# Patient Record
Sex: Male | Born: 1985 | Race: White | Hispanic: No | Marital: Married | Smoking: Never smoker
Health system: Southern US, Community
[De-identification: ages and names within clinical notes are randomized; demographics above are authoritative.]

## PROBLEM LIST (undated history)

## (undated) DIAGNOSIS — K5010 Crohn's disease of large intestine without complications: Secondary | ICD-10-CM

## (undated) DIAGNOSIS — K219 Gastro-esophageal reflux disease without esophagitis: Secondary | ICD-10-CM

## (undated) HISTORY — PX: NO PAST SURGERIES: SHX2092

---

## 2009-02-04 ENCOUNTER — Ambulatory Visit: Payer: Self-pay | Admitting: Internal Medicine

## 2009-02-06 ENCOUNTER — Ambulatory Visit: Payer: Self-pay | Admitting: Internal Medicine

## 2009-02-08 ENCOUNTER — Ambulatory Visit: Payer: Self-pay | Admitting: Internal Medicine

## 2012-12-20 ENCOUNTER — Emergency Department: Payer: Self-pay | Admitting: Unknown Physician Specialty

## 2012-12-20 LAB — URINALYSIS, COMPLETE
Bilirubin,UR: NEGATIVE
Leukocyte Esterase: NEGATIVE
Protein: 100
RBC,UR: 1 /HPF (ref 0–5)
Specific Gravity: 1.035 (ref 1.003–1.030)
WBC UR: <1 /HPF (ref 0–5)

## 2012-12-20 LAB — COMPREHENSIVE METABOLIC PANEL
Albumin: 4.9 g/dL (ref 3.4–5.0)
Anion Gap: 10 (ref 7–16)
BUN: 16 mg/dL (ref 7–18)
Bilirubin,Total: 1.5 mg/dL — ABNORMAL HIGH (ref 0.2–1.0)
Co2: 21 mmol/L (ref 21–32)
EGFR (Non-African Amer.): >60
Osmolality: 271 (ref 275–301)
SGPT (ALT): 41 U/L (ref 12–78)
Total Protein: 9 g/dL — ABNORMAL HIGH (ref 6.4–8.2)

## 2012-12-20 LAB — CBC
HGB: 16.6 g/dL (ref 13.0–18.0)
MCH: 30.6 pg (ref 26.0–34.0)
MCV: 88 fL (ref 80–100)
RBC: 5.42 10*6/uL (ref 4.40–5.90)

## 2012-12-20 LAB — CK TOTAL AND CKMB (NOT AT ARMC): CK, Total: 49 U/L (ref 35–232)

## 2013-01-07 ENCOUNTER — Emergency Department: Payer: Self-pay | Admitting: Emergency Medicine

## 2013-01-07 LAB — COMPREHENSIVE METABOLIC PANEL
Albumin: 5 g/dL (ref 3.4–5.0)
Alkaline Phosphatase: 75 U/L (ref 50–136)
Anion Gap: 6 — ABNORMAL LOW (ref 7–16)
Co2: 29 mmol/L (ref 21–32)
Creatinine: 1.1 mg/dL (ref 0.60–1.30)
Glucose: 98 mg/dL (ref 65–99)
Osmolality: 276 (ref 275–301)
SGOT(AST): 16 U/L (ref 15–37)
Total Protein: 8.8 g/dL — ABNORMAL HIGH (ref 6.4–8.2)

## 2013-01-07 LAB — URINALYSIS, COMPLETE
Bacteria: NONE SEEN
Bilirubin,UR: NEGATIVE
Blood: NEGATIVE
Ketone: NEGATIVE
Ph: 7 (ref 4.5–8.0)
RBC,UR: <1 /HPF (ref 0–5)
Specific Gravity: 1.008 (ref 1.003–1.030)
Squamous Epithelial: NONE SEEN

## 2013-01-07 LAB — CBC
HGB: 16.3 g/dL (ref 13.0–18.0)
MCH: 30.9 pg (ref 26.0–34.0)
MCHC: 35.2 g/dL (ref 32.0–36.0)
MCV: 88 fL (ref 80–100)
RBC: 5.29 10*6/uL (ref 4.40–5.90)
RDW: 12.8 % (ref 11.5–14.5)

## 2013-01-07 LAB — TROPONIN I: Troponin-I: <0.02 ng/mL

## 2013-01-07 LAB — LIPASE, BLOOD: Lipase: 81 U/L (ref 73–393)

## 2013-01-07 LAB — CK TOTAL AND CKMB (NOT AT ARMC): CK-MB: <0.5 ng/mL — ABNORMAL LOW (ref 0.5–3.6)

## 2013-01-08 ENCOUNTER — Emergency Department: Payer: Self-pay | Admitting: Emergency Medicine

## 2013-01-10 ENCOUNTER — Emergency Department: Payer: Self-pay | Admitting: Emergency Medicine

## 2013-01-10 LAB — CBC
MCH: 30.7 pg (ref 26.0–34.0)
MCHC: 35.3 g/dL (ref 32.0–36.0)
MCV: 87 fL (ref 80–100)
Platelet: 264 10*3/uL (ref 150–440)
RBC: 5.27 10*6/uL (ref 4.40–5.90)
RDW: 12.6 % (ref 11.5–14.5)
WBC: 8 10*3/uL (ref 3.8–10.6)

## 2013-01-10 LAB — URINALYSIS, COMPLETE
Bacteria: NONE SEEN
Blood: NEGATIVE
Glucose,UR: NEGATIVE mg/dL (ref 0–75)
Leukocyte Esterase: NEGATIVE
Protein: NEGATIVE
Specific Gravity: 1.017 (ref 1.003–1.030)
Squamous Epithelial: NONE SEEN

## 2013-01-10 LAB — COMPREHENSIVE METABOLIC PANEL
Albumin: 4.9 g/dL (ref 3.4–5.0)
Alkaline Phosphatase: 70 U/L (ref 50–136)
Anion Gap: 8 (ref 7–16)
BUN: 12 mg/dL (ref 7–18)
Bilirubin,Total: 1.5 mg/dL — ABNORMAL HIGH (ref 0.2–1.0)
Calcium, Total: 9.7 mg/dL (ref 8.5–10.1)
Chloride: 101 mmol/L (ref 98–107)
Co2: 30 mmol/L (ref 21–32)
Creatinine: 1.17 mg/dL (ref 0.60–1.30)
EGFR (Non-African Amer.): >60
Glucose: 92 mg/dL (ref 65–99)
Osmolality: 277 (ref 275–301)
Potassium: 3.9 mmol/L (ref 3.5–5.1)
SGOT(AST): 25 U/L (ref 15–37)
SGPT (ALT): 35 U/L (ref 12–78)

## 2013-02-01 ENCOUNTER — Ambulatory Visit: Payer: Self-pay | Admitting: Gastroenterology

## 2013-02-03 LAB — PATHOLOGY REPORT

## 2013-02-11 ENCOUNTER — Emergency Department: Payer: Self-pay | Admitting: Unknown Physician Specialty

## 2013-02-11 LAB — COMPREHENSIVE METABOLIC PANEL
Albumin: 4.9 g/dL (ref 3.4–5.0)
Alkaline Phosphatase: 68 U/L (ref 50–136)
BUN: 9 mg/dL (ref 7–18)
Bilirubin,Total: 1.6 mg/dL — ABNORMAL HIGH (ref 0.2–1.0)
Calcium, Total: 9.9 mg/dL (ref 8.5–10.1)
Chloride: 101 mmol/L (ref 98–107)
Co2: 26 mmol/L (ref 21–32)
EGFR (African American): >60
EGFR (Non-African Amer.): >60
Glucose: 93 mg/dL (ref 65–99)
Potassium: 4.3 mmol/L (ref 3.5–5.1)
SGOT(AST): 24 U/L (ref 15–37)
Sodium: 137 mmol/L (ref 136–145)
Total Protein: 8.9 g/dL — ABNORMAL HIGH (ref 6.4–8.2)

## 2013-02-11 LAB — CBC
HGB: 16.4 g/dL (ref 13.0–18.0)
MCH: 30.5 pg (ref 26.0–34.0)
MCHC: 35.1 g/dL (ref 32.0–36.0)
MCV: 87 fL (ref 80–100)
Platelet: 254 10*3/uL (ref 150–440)
RBC: 5.39 10*6/uL (ref 4.40–5.90)
RDW: 12.7 % (ref 11.5–14.5)

## 2013-02-11 LAB — URINALYSIS, COMPLETE
Bilirubin,UR: NEGATIVE
Blood: NEGATIVE
Glucose,UR: NEGATIVE mg/dL (ref 0–75)
Leukocyte Esterase: NEGATIVE
Nitrite: NEGATIVE
Protein: 100
WBC UR: 1 /HPF (ref 0–5)

## 2013-02-11 LAB — MAGNESIUM: Magnesium: 1.8 mg/dL

## 2013-02-11 LAB — LIPASE, BLOOD: Lipase: 119 U/L (ref 73–393)

## 2014-05-05 ENCOUNTER — Ambulatory Visit: Payer: Self-pay | Admitting: Physician Assistant

## 2014-05-08 ENCOUNTER — Emergency Department: Payer: Self-pay | Admitting: Emergency Medicine

## 2014-07-05 IMAGING — CR DG CHEST 1V PORT
1 series · 2 of 2 positions shown · non-contrast
Comparison: none

REASON FOR EXAM: Chest pain
COMMENTS:

[Series 1: ap · 0.17mm/px · 2 of 2 slices shown]
[im 1/2]
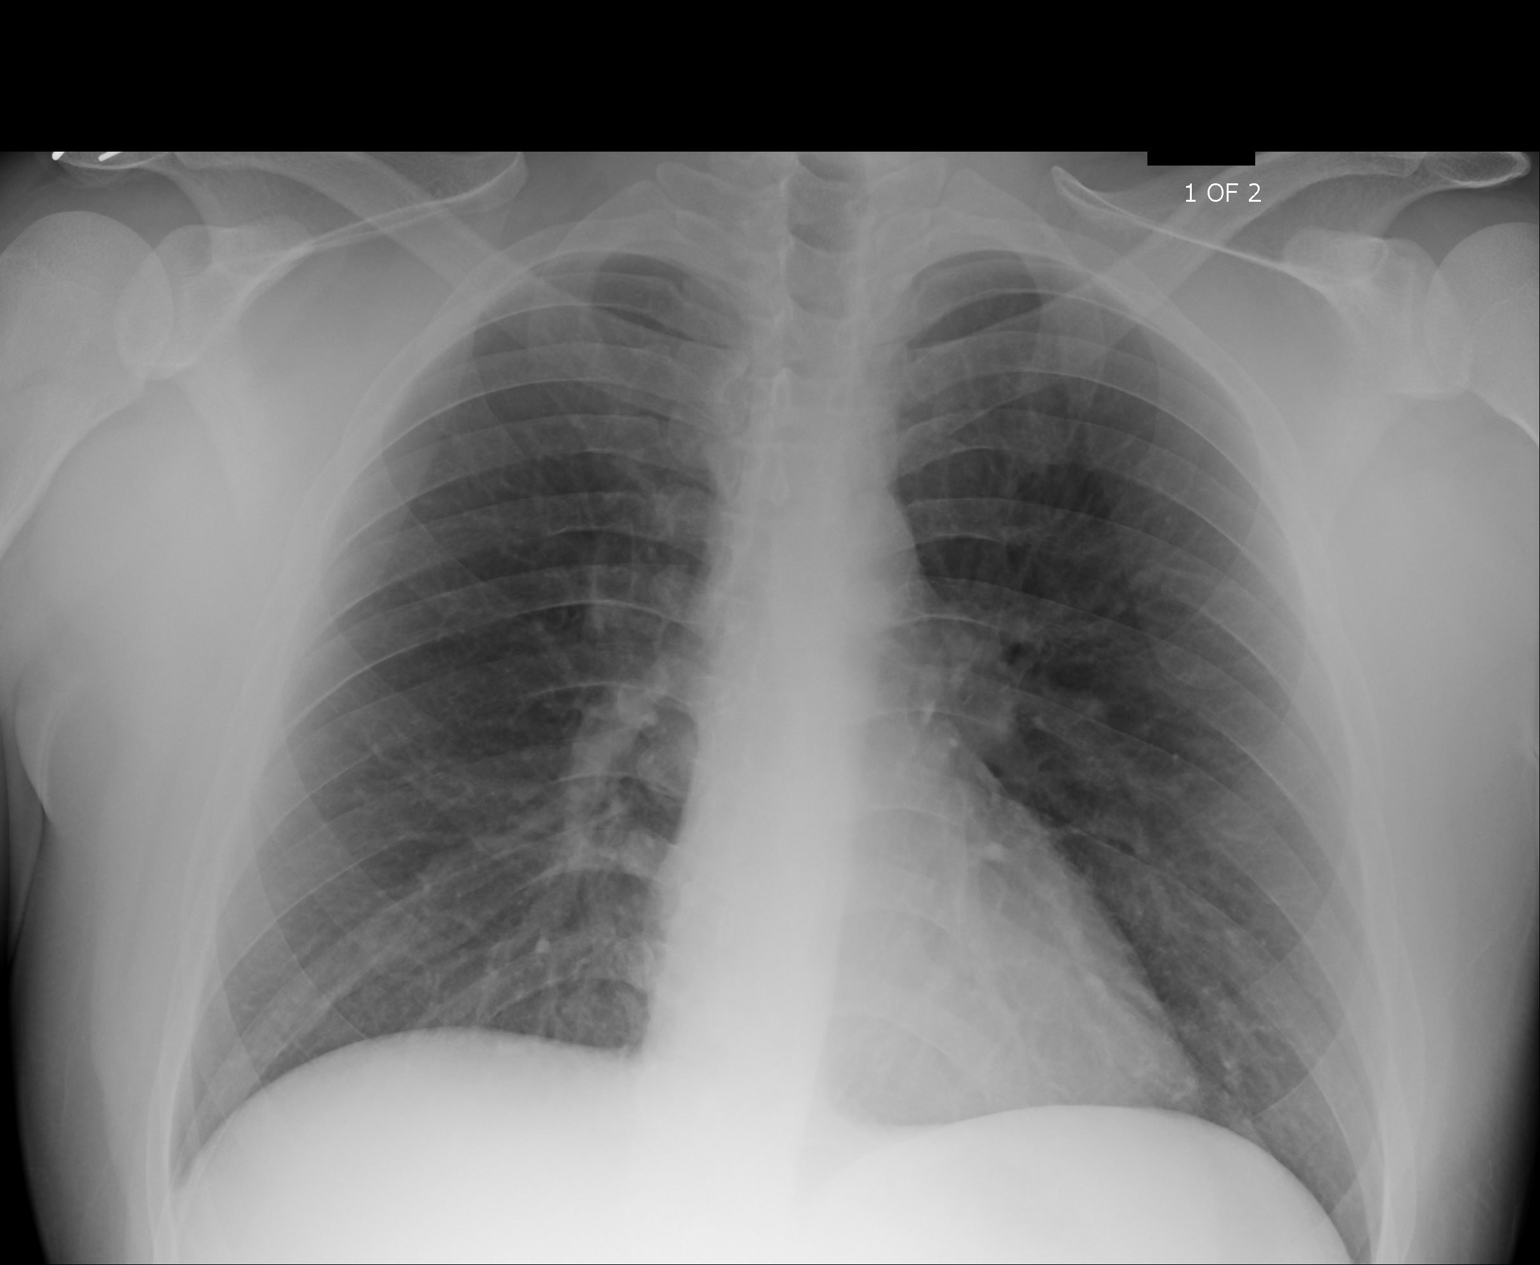
[im 2/2]
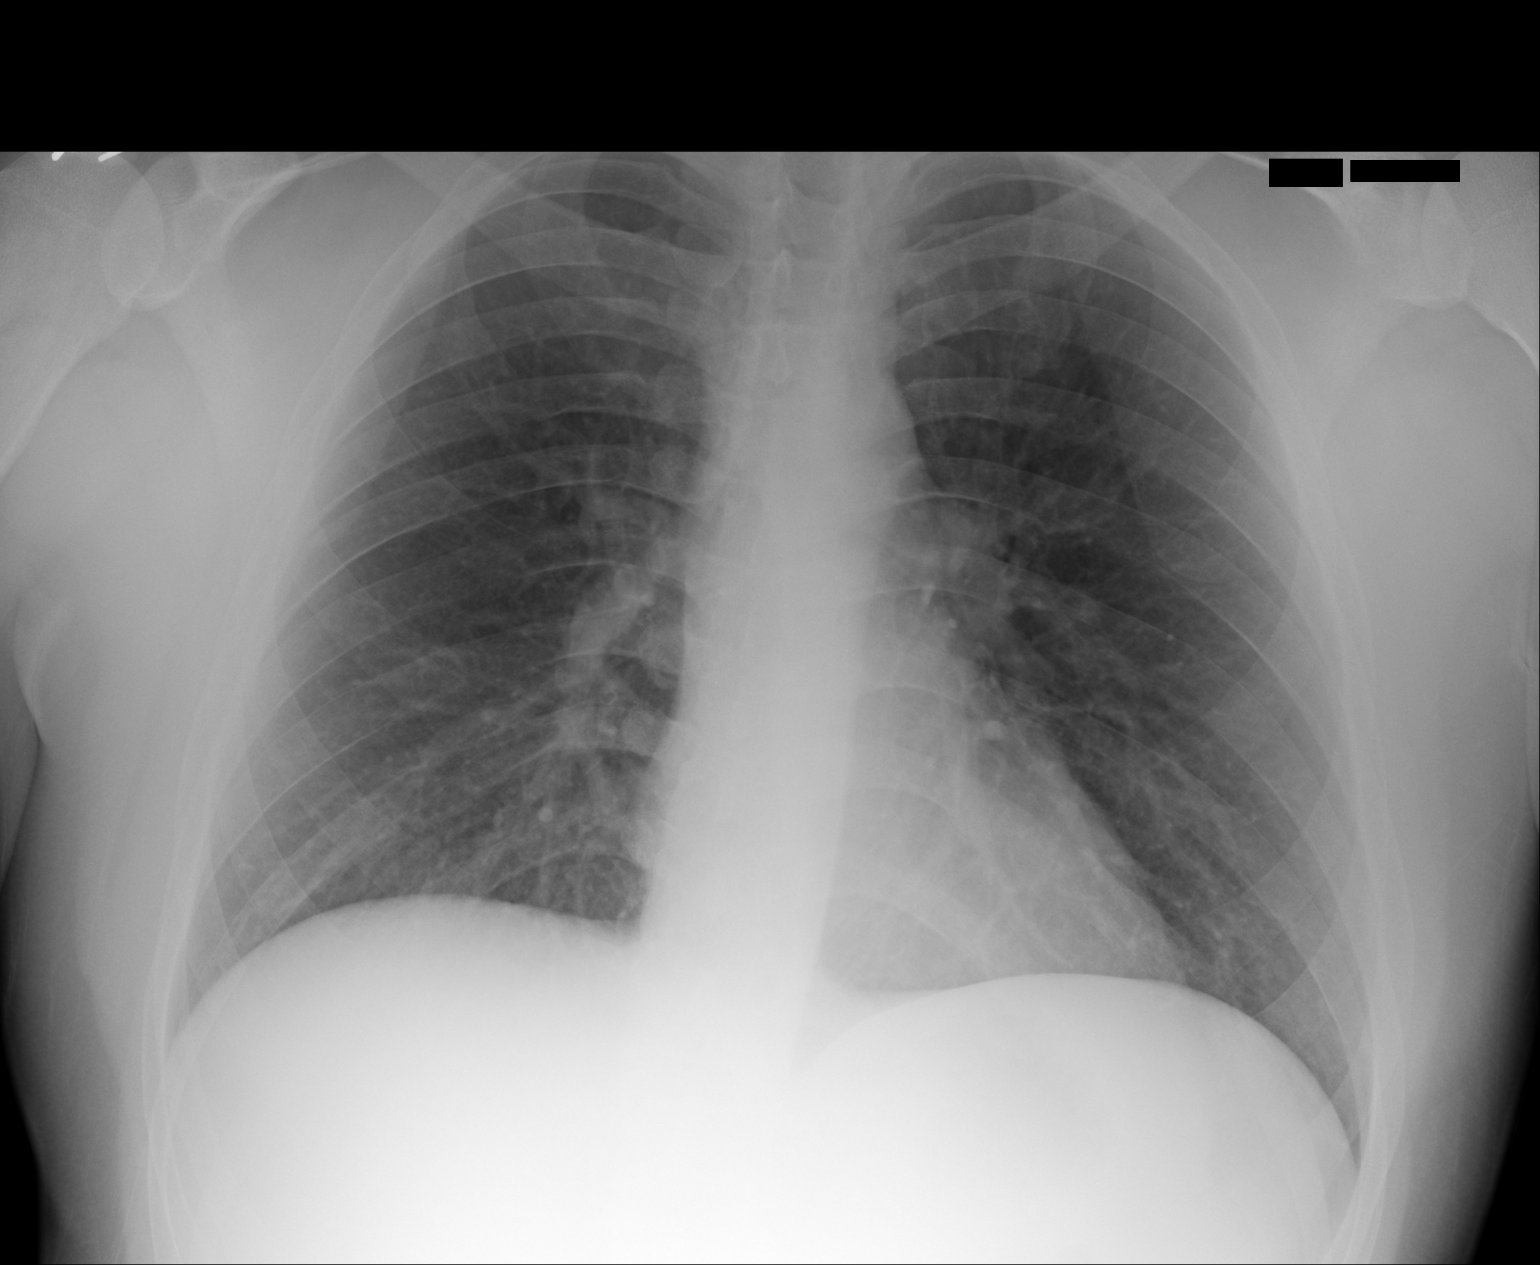

[2 of 2 positions shown; findings below may reference images not displayed]

PROCEDURE:     DXR - DXR PORTABLE CHEST SINGLE VIEW  - January 07, 2013 [DATE]

RESULT:     The lungs are adequately inflated. The cardiac silhouette is
normal in size. There is no pleural effusion or pneumothorax. The
interstitial markings are increased bilaterally. The central pulmonary
vascularity is prominent.
IMPRESSION: Mildly increased interstitial markings diffusely suggest
interstitial edema which may be of cardiac or noncardiac cause. There is no
alveolar pneumonia nor pleural effusion.

[REDACTED]

## 2014-07-06 IMAGING — US ABDOMEN ULTRASOUND LIMITED
1 series · 14 of 25 positions shown · non-contrast
Comparison: none

REASON FOR EXAM: nausea, abd pain - eval for biliary disease
COMMENTS:   Body Site: GB and Fossa, CBD, Head of Pancreas

[Series 1: abdomen ultrasound limited · 0.28mm/px · 14 of 36 slices shown]
[im 1/36]
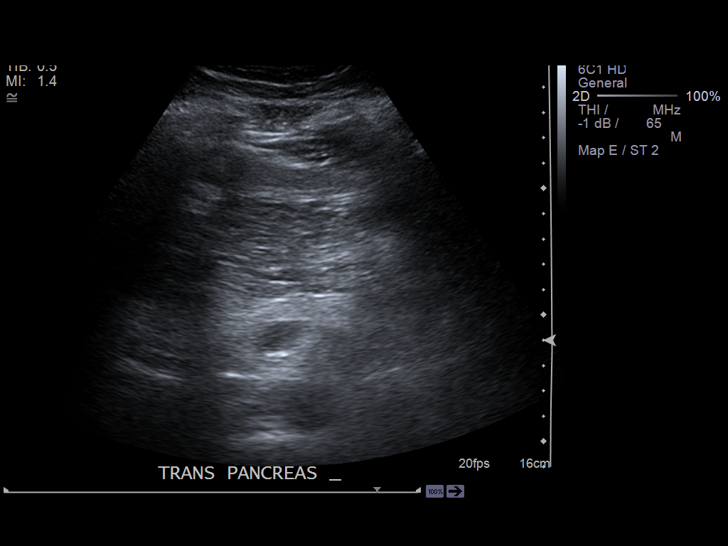
[im 3/36]
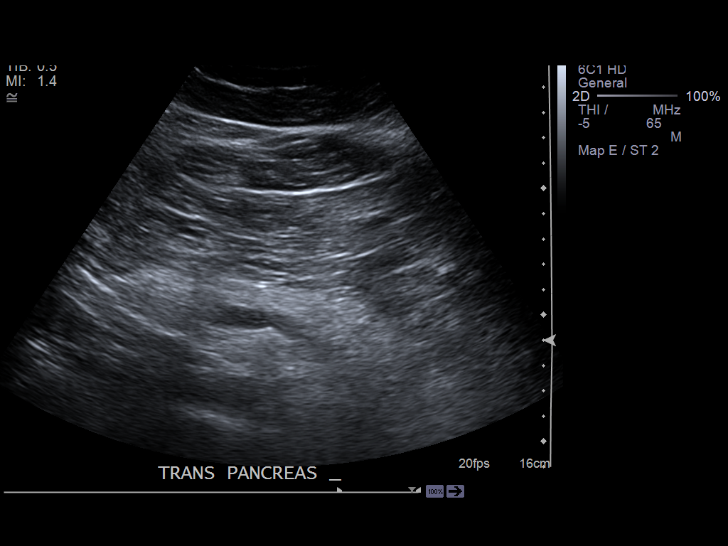
[im 6/36]
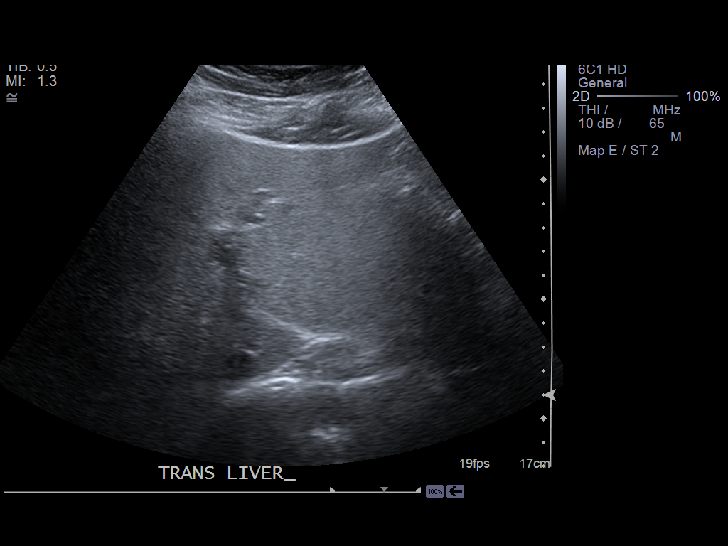
[im 9/36]
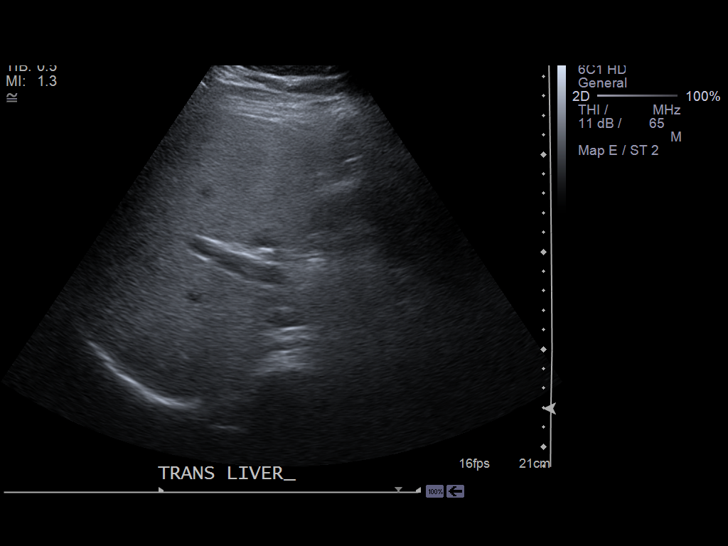
[im 12/36]
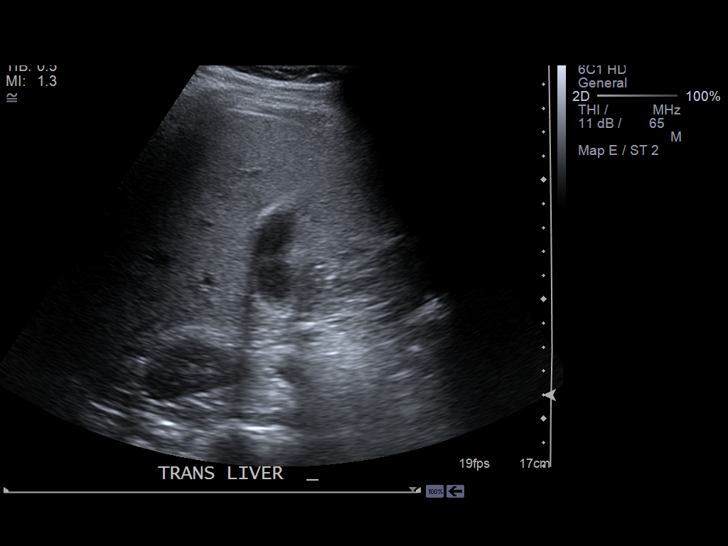
[im 14/36]
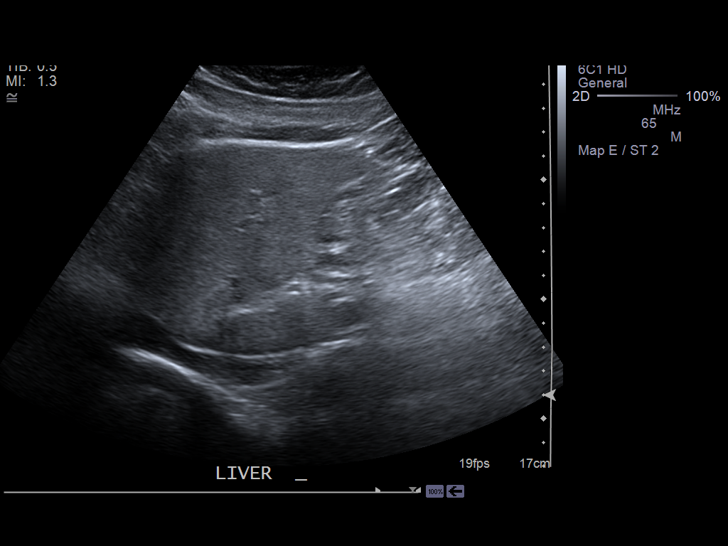
[im 17/36]
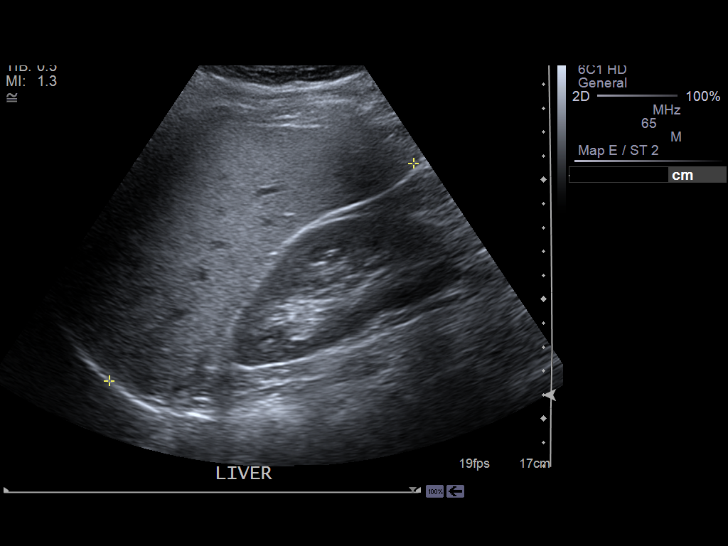
[im 19/36]
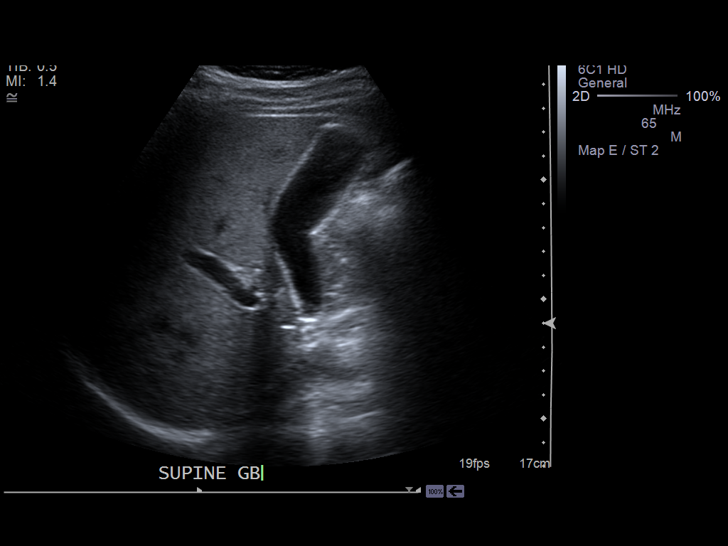
[im 22/36]
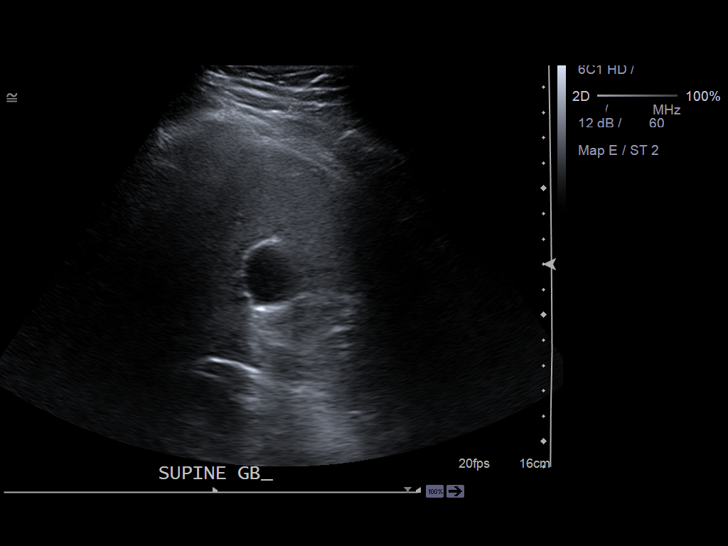
[im 24/36]
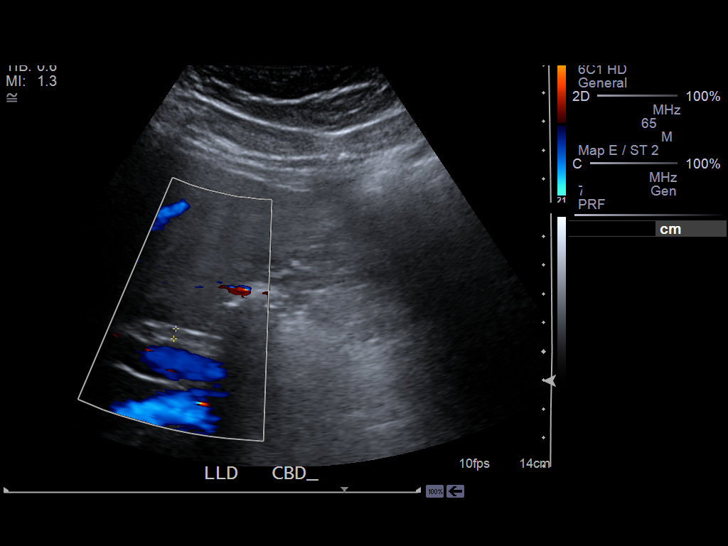
[im 27/36]
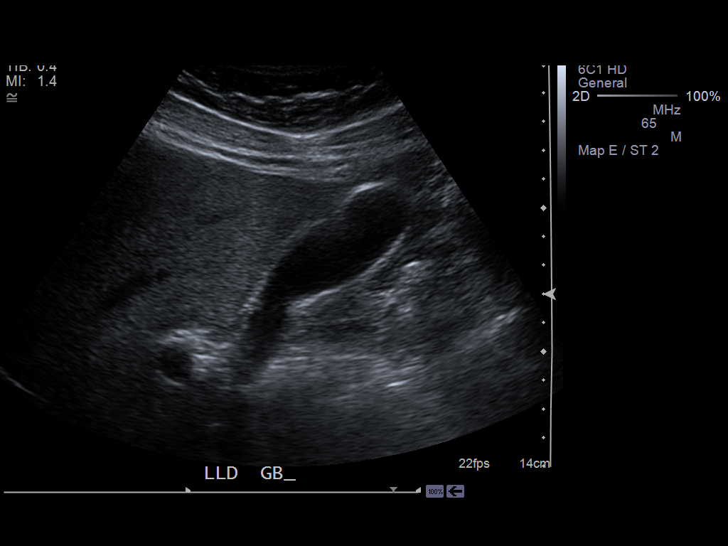
[im 30/36]
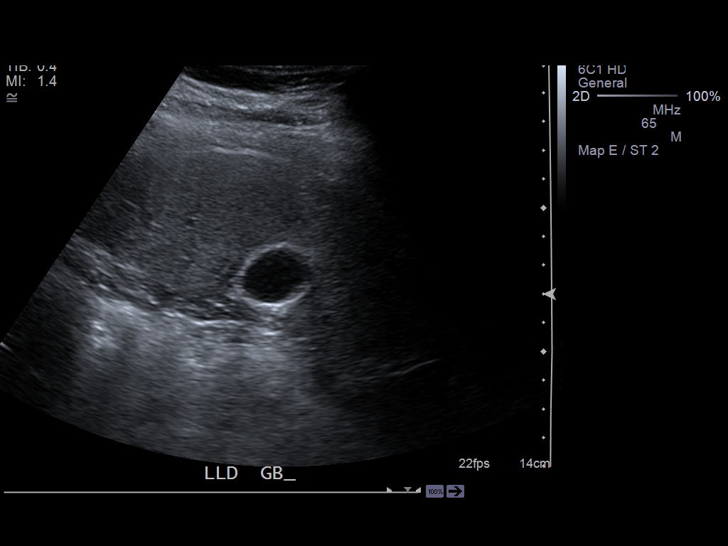
[im 33/36]
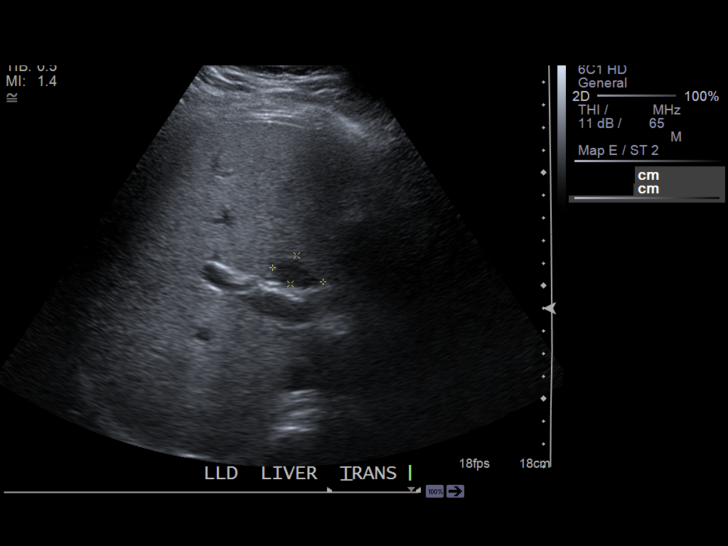
[im 36/36]
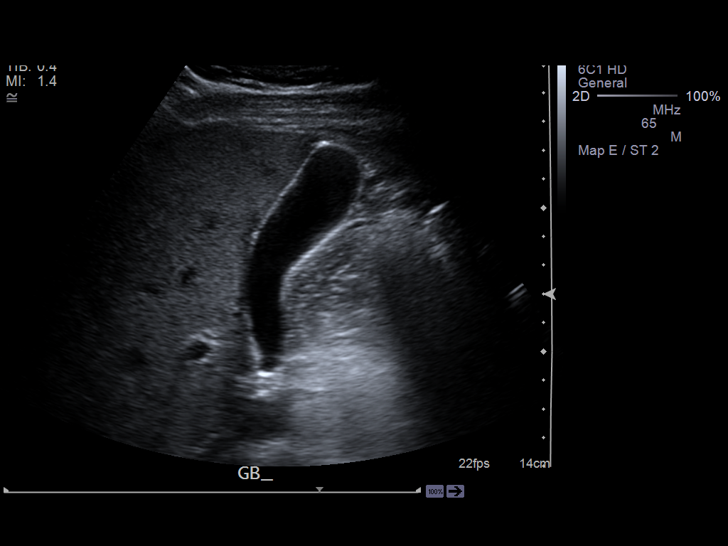

[14 of 25 positions shown; findings below may reference images not displayed]

PROCEDURE:     US  - US ABDOMEN LIMITED SURVEY  - January 08, 2013  [DATE]

RESULT:     A limited upper abdominal ultrasound was performed.

The liver exhibits mildly increased echotexture consistent with fatty
infiltration. An area of presumed focal fatty sparing is demonstrated near
the porta hepatis measuring 2.1 x 1.2 x 2.3 cm. There is no intrahepatic
ductal dilation. Portal venous flow is normal in direction toward the liver.
The gallbladder is adequately distended with no evidence of stones, wall
thickening, or pericholecystic fluid. There is no positive sonographic
Murphy's sign. The common bile duct is normal at 3.5 mm in diameter.
Portions of the pancreas were obscured by bowel gas but the visualized
portion appears normal.
IMPRESSION: 1. There are fatty infiltrative changes of the liver.
2. There is no acute abnormality of the gallbladder common bile duct. No
stones are evident.
3. The observed portions of the pancreas appear normal.

[REDACTED]

## 2015-01-14 ENCOUNTER — Ambulatory Visit
Admission: EM | Admit: 2015-01-14 | Discharge: 2015-01-14 | Disposition: A | Discharge status: Home / Self Care | Payer: No Typology Code available for payment source | Attending: Internal Medicine | Admitting: Internal Medicine

## 2015-01-14 ENCOUNTER — Encounter: Payer: Self-pay | Admitting: *Deleted

## 2015-01-14 DIAGNOSIS — L0103 Bullous impetigo: Secondary | ICD-10-CM | POA: Diagnosis not present

## 2015-01-14 MED ORDER — SULFAMETHOXAZOLE-TRIMETHOPRIM 800-160 MG PO TABS: 1.0000 | ORAL_TABLET | Freq: Two times a day (BID) | ORAL | Status: DC

## 2015-01-14 MED ORDER — SULFAMETHOXAZOLE-TRIMETHOPRIM 800-160 MG PO TABS: 1.0000 | ORAL_TABLET | Freq: Two times a day (BID) | ORAL | Status: AC

## 2015-01-14 MED ORDER — CEPHALEXIN 500 MG PO CAPS: 500.0000 mg | ORAL_CAPSULE | Freq: Two times a day (BID) | ORAL | Status: DC

## 2015-01-14 NOTE — ED Provider Notes (Signed)
CSN: [id number]     Arrival date & time 01/14/15  1318 History   First MD Initiated Contact with Patient 01/14/15 1408     Chief Complaint  Patient presents with  . Rash    on face   . Blister    on hands    Patient is a 29 y.o. male presenting with rash.  Rash  Pt states "rash on face and blisters on hands started yesterday, had fever and vomiting/diarrhea symptoms on Friday lasted until Sunday morning, daughter had hand mouth and foot last week"           Currently, no fever, no malaise.  No new meds, supplements, skin care products, other exposures. GI sx's have subsided.  No cough.  Rash onset yesterday.   History reviewed. No pertinent past medical history. History reviewed. No pertinent past surgical history. No family history on file. History  Substance Use Topics  . Smoking status: Never Smoker   . Smokeless tobacco: Not on file  . Alcohol Use: No    Review of Systems  Skin: Positive for rash.  All other systems reviewed and are negative.   Allergies  Review of patient's allergies indicates no known allergies.  Home Medications   Prior to Admission medications   Medication Sig Start Date End Date Taking? Authorizing Provider  ranitidine (ZANTAC) 150 MG tablet Take 150 mg by mouth 2 (two) times daily.   Yes Historical Provider, MD                 BP 129/70 mmHg  Pulse 82  Temp(Src) 98.2 F (36.8 C) (Oral)  Resp 16  Ht 5' 11"  (1.803 m)  Wt 225 lb (102.059 kg)  BMI 31.39 kg/m2  SpO2 100% Physical Exam  Constitutional: He is oriented to person, place, and time. No distress.  Alert, nicely groomed  HENT:  Head: Atraumatic.  Eyes:  Conjugate gaze, no eye redness/drainage  Neck: Neck supple.  Cardiovascular: Normal rate.   Pulmonary/Chest: No respiratory distress.  Abdominal: Soft. He exhibits no distension.  Musculoskeletal: Normal range of motion.  Neurological: He is alert and oriented to person, place, and time.  Skin: Skin is warm and dry.       No cyanosis Rash on face, neck, dorsal hands/forearms primarily. See diagram.  Red papules on face, neck, 3-32m, with shallow erosions and golden crusting under/in nares.   Excoriated papules dorsal forearms, 10-19m with some golden crust.   Deep/firm blisters proximal fingers, not really tender  Nursing note and vitals reviewed.   ED Course  Procedures  No procedures in the urgent care today.  MDM   1. Impetigo bullosa    rx bactrim/keflex to cover MRSA/strep.  Wash skin with soap/water 1-2x daily, gently, and apply abx ointment.  Anticipate gradual improvement over the next 3-5 days.    LaSherlene ShamsMD 01/14/15 [phone]

## 2015-01-14 NOTE — Discharge Instructions (Signed)
°  Prescriptions for bactrim and keflex. Wash raw patches with soap/water 1-2 times daily and apply antibiotic ointment. Anticipate gradual improvement over the next 3-5 days.

## 2015-01-14 NOTE — ED Notes (Signed)
Pt states "rash on face and blisters on hands started yesterday, had fever and flu like symptoms on Friday lasted until Sunday morning, daughter had hand mouth and foot last week"

## 2017-06-25 ENCOUNTER — Encounter: Payer: Self-pay | Admitting: Emergency Medicine

## 2017-06-25 ENCOUNTER — Other Ambulatory Visit: Payer: Self-pay

## 2017-06-25 ENCOUNTER — Emergency Department
Admission: EM | Admit: 2017-06-25 | Discharge: 2017-06-25 | Disposition: A | Discharge status: Home / Self Care | Payer: Self-pay | Attending: Student in an Organized Health Care Education/Training Program | Admitting: Student in an Organized Health Care Education/Training Program

## 2017-06-25 ENCOUNTER — Emergency Department: Payer: Self-pay

## 2017-06-25 DIAGNOSIS — R1013 Epigastric pain: Secondary | ICD-10-CM | POA: Insufficient documentation

## 2017-06-25 DIAGNOSIS — Z79899 Other long term (current) drug therapy: Secondary | ICD-10-CM | POA: Insufficient documentation

## 2017-06-25 DIAGNOSIS — R112 Nausea with vomiting, unspecified: Secondary | ICD-10-CM | POA: Insufficient documentation

## 2017-06-25 LAB — CBC WITH DIFFERENTIAL/PLATELET
BASOS PCT: 1 %
Basophils Absolute: 0.1 10*3/uL (ref 0–0.1)
Eosinophils Absolute: 0 10*3/uL (ref 0–0.7)
Eosinophils Relative: 0 %
HEMATOCRIT: 48 % (ref 40.0–52.0)
Hemoglobin: 16.7 g/dL (ref 13.0–18.0)
LYMPHS PCT: 6 %
Lymphs Abs: 0.6 10*3/uL — ABNORMAL LOW (ref 1.0–3.6)
MCH: 30.4 pg (ref 26.0–34.0)
MCHC: 34.7 g/dL (ref 32.0–36.0)
MCV: 87.6 fL (ref 80.0–100.0)
MONO ABS: 0.5 10*3/uL (ref 0.2–1.0)
MONOS PCT: 5 %
NEUTROS ABS: 8.9 10*3/uL — AB (ref 1.4–6.5)
Neutrophils Relative %: 88 %
Platelets: 234 10*3/uL (ref 150–440)
RBC: 5.48 MIL/uL (ref 4.40–5.90)
RDW: 12.6 % (ref 11.5–14.5)
WBC: 10.1 10*3/uL (ref 3.8–10.6)

## 2017-06-25 LAB — COMPREHENSIVE METABOLIC PANEL
ALT: 54 U/L (ref 17–63)
ANION GAP: 9 (ref 5–15)
AST: 34 U/L (ref 15–41)
Albumin: 4.9 g/dL (ref 3.5–5.0)
Alkaline Phosphatase: 43 U/L (ref 38–126)
BILIRUBIN TOTAL: 1.1 mg/dL (ref 0.3–1.2)
BUN: 18 mg/dL (ref 6–20)
CO2: 26 mmol/L (ref 22–32)
Calcium: 9.7 mg/dL (ref 8.9–10.3)
Chloride: 103 mmol/L (ref 101–111)
Creatinine, Ser: 0.89 mg/dL (ref 0.61–1.24)
GFR calc Af Amer: >60 mL/min (ref >60)
Glucose, Bld: 134 mg/dL — ABNORMAL HIGH (ref 65–99)
POTASSIUM: 4.7 mmol/L (ref 3.5–5.1)
Sodium: 138 mmol/L (ref 135–145)
TOTAL PROTEIN: 8.1 g/dL (ref 6.5–8.1)

## 2017-06-25 LAB — LIPASE, BLOOD: LIPASE: 23 U/L (ref 11–51)

## 2017-06-25 MED ORDER — PROMETHAZINE HCL 12.5 MG PO TABS: 12.5000 mg | ORAL_TABLET | Freq: Four times a day (QID) | ORAL | 0 refills | Status: DC | PRN

## 2017-06-25 MED ORDER — GI COCKTAIL ~~LOC~~
30.0000 mL | Freq: Once | ORAL | Status: AC
  Administered 2017-06-25: 30 mL via ORAL
  Filled 2017-06-25: qty 30

## 2017-06-25 MED ORDER — SUCRALFATE 1 GM/10ML PO SUSP: 1.0000 g | Freq: Four times a day (QID) | ORAL | 1 refills | Status: DC

## 2017-06-25 MED ORDER — PROMETHAZINE HCL 25 MG/ML IJ SOLN
12.5000 mg | Freq: Four times a day (QID) | INTRAMUSCULAR | Status: DC | PRN
  Administered 2017-06-25: 12.5 mg via INTRAVENOUS
  Filled 2017-06-25: qty 1

## 2017-06-25 MED ORDER — PROMETHAZINE HCL 25 MG/ML IJ SOLN: 25.0000 mg | Freq: Four times a day (QID) | INTRAMUSCULAR | Status: DC | PRN

## 2017-06-25 NOTE — ED Triage Notes (Signed)
Arrives with c/o abdominal pain and vomiting since last night.

## 2017-06-25 NOTE — Discharge Instructions (Signed)

## 2017-06-25 NOTE — ED Provider Notes (Signed)
[facility] Emergency Department Provider Note    First MD Initiated Contact with Patient 06/25/17 [phone]     (approximate)  I have reviewed the triage vital signs and the nursing notes.   HISTORY  Chief Complaint Abdominal Pain and Emesis    HPI John Hayes is a 31 y.o. male with a history of acid reflux not currently taking any medications with no recent hospitalizations presents with pain.  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  Denies any fevers.  No chest pain or shortness of breath.  Points to just below his xiphoid and epigastric area when asked where the pain is.  States that it is mild to moderate in severity.  No pain radiating through to his back.  No diarrhea.  No blood in his vomit.  No dysuria or hematuria.  History reviewed. No pertinent past medical history. No family history on file. History reviewed. No pertinent surgical history. There are no active problems to display for this patient.     Prior to Admission medications   Medication Sig Start Date End Date Taking? Authorizing Provider  cephALEXin (KEFLEX) 500 MG capsule Take 1 capsule (500 mg total) by mouth 2 (two) times daily. 01/14/15   Sherlene Shams, MD  ranitidine (ZANTAC) 150 MG tablet Take 150 mg by mouth 2 (two) times daily.    [provider]    Allergies Zofran Alvis Lemmings hcl]    Social History Social History   Tobacco Use  . Smoking status: Never Smoker  . Smokeless tobacco: Never Used  Substance Use Topics  . Alcohol use: No  . Drug use: No    Review of Systems Patient denies headaches, rhinorrhea, blurry vision, numbness, shortness of breath, chest pain, edema, cough, abdominal pain, nausea, vomiting, diarrhea, dysuria, fevers, rashes or hallucinations unless otherwise stated above in  HPI. ____________________________________________   PHYSICAL EXAM:  VITAL SIGNS: Vitals:   06/25/17 0826  BP: 126/76  Pulse: 93  Resp: 16  Temp: 98.1 F (36.7 C)  SpO2: 99%    Constitutional: Alert and oriented. Well appearing and in no acute distress. Eyes: Conjunctivae are normal.  Head: Atraumatic. Nose: No congestion/rhinnorhea. Mouth/Throat: Mucous membranes are moist.   Neck: No stridor. Painless ROM.  Cardiovascular: Normal rate, regular rhythm. Grossly normal heart sounds.  Good peripheral circulation. Respiratory: Normal respiratory effort.  No retractions. Lungs CTAB. Gastrointestinal: Soft and nontender. No distention. No abdominal bruits. No CVA tenderness. Genitourinary:  Musculoskeletal: No lower extremity tenderness nor edema.  No joint effusions. Neurologic:  Normal speech and language. No gross focal neurologic deficits are appreciated. No facial droop Skin:  Skin is warm, dry and intact. No rash noted. Psychiatric: Mood and affect are normal. Speech and behavior are normal.  ____________________________________________   LABS (all labs ordered are listed, but only abnormal results are displayed)  Results for orders placed or performed during the hospital encounter of 06/25/17 (from the past 24 hour(s))  CBC with Differential/Platelet     Status: Abnormal   Collection Time: 06/25/17  8:45 AM  Result Value Ref Range   WBC 10.1 3.8 - 10.6 K/uL   RBC 5.48 4.40 - 5.90 MIL/uL   Hemoglobin 16.7 13.0 - 18.0 g/dL   HCT 48.0 40.0 - 52.0 %   MCV 87.6 80.0 - 100.0 fL   MCH 30.4 26.0 - 34.0 pg  MCHC 34.7 32.0 - 36.0 g/dL   RDW 12.6 11.5 - 14.5 %   Platelets 234 150 - 440 K/uL   Neutrophils Relative % 88 %   Neutro Abs 8.9 (H) 1.4 - 6.5 K/uL   Lymphocytes Relative 6 %   Lymphs Abs 0.6 (L) 1.0 - 3.6 K/uL   Monocytes Relative 5 %   Monocytes Absolute 0.5 0.2 - 1.0 K/uL   Eosinophils Relative 0 %   Eosinophils Absolute 0.0 0 - 0.7 K/uL   Basophils Relative  1 %   Basophils Absolute 0.1 0 - 0.1 K/uL  Comprehensive metabolic panel     Status: Abnormal   Collection Time: 06/25/17  8:45 AM  Result Value Ref Range   Sodium 138 135 - 145 mmol/L   Potassium 4.7 3.5 - 5.1 mmol/L   Chloride 103 101 - 111 mmol/L   CO2 26 22 - 32 mmol/L   Glucose, Bld 134 (H) 65 - 99 mg/dL   BUN 18 6 - 20 mg/dL   Creatinine, Ser 0.89 0.61 - 1.24 mg/dL   Calcium 9.7 8.9 - 10.3 mg/dL   Total Protein 8.1 6.5 - 8.1 g/dL   Albumin 4.9 3.5 - 5.0 g/dL   AST 34 15 - 41 U/L   ALT 54 17 - 63 U/L   Alkaline Phosphatase 43 38 - 126 U/L   Total Bilirubin 1.1 0.3 - 1.2 mg/dL   GFR calc non Af Amer >60 >60 mL/min   GFR calc Af Amer >60 >60 mL/min   Anion gap 9 5 - 15  Lipase, blood     Status: None   Collection Time: 06/25/17  8:45 AM  Result Value Ref Range   Lipase 23 11 - 51 U/L   ____________________________________________ ____________________________________________  RADIOLOGY  I personally reviewed all radiographic images ordered to evaluate for the above acute complaints and reviewed radiology reports and findings.  These findings were personally discussed with the patient.  Please see medical record for radiology report.  ____________________________________________   PROCEDURES  Procedure(s) performed:  Procedures    Critical Care performed: no ____________________________________________   INITIAL IMPRESSION / ASSESSMENT AND PLAN / ED COURSE  Pertinent labs & imaging results that were available during my care of the patient were reviewed by me and considered in my medical decision making (see chart for details).  DDX: Gastritis, reflux, Boerhaave's, peptic ulcer disease, enteritis.  John Hayes is a 31 y.o. who presents to the ED with epigastric discomfort nausea and vomiting as described above.  He is afebrile and hemodynamically stable and well-appearing.  Abdominal exam is soft and benign.  This not clinically consistent with acute  biliary pathology or acute appendicitis.  Blood work is reassuring.  Patient does have history of long-standing reflux disease.  Encouraged patient to restart taking his ranitidine as he states he stopped taking that several weeks ago.  Will give prescription for Phenergan and Carafate with referral to GI.  Patient with symptoms improved after GI cocktail and IV medications.  Patient stable for discharge home.  Instructed to return in 12-24 hours should his pain worsen or he develops any fevers.  Have discussed with the patient and available family all diagnostics and treatments performed thus far and all questions were answered to the best of my ability. The patient demonstrates understanding and agreement with plan.       ____________________________________________   FINAL CLINICAL IMPRESSION(S) / ED DIAGNOSES  Final diagnoses:  Non-intractable vomiting with nausea, unspecified vomiting type  Epigastric pain      NEW MEDICATIONS STARTED DURING THIS VISIT:  This SmartLink is deprecated. Use AVSMEDLIST instead to display the medication list for a patient.   Note:  This document was prepared using Dragon voice recognition software and may include unintentional dictation errors.    Merlyn Lot, MD 06/25/17 [phone]

## 2017-06-25 NOTE — ED Notes (Signed)
First Nurse Note:  Abdominal pain with nausea and vomiting starting last PM.  Speech clear, no apparent distress.

## 2017-08-10 DIAGNOSIS — K50012 Crohn's disease of small intestine with intestinal obstruction: Secondary | ICD-10-CM | POA: Diagnosis not present

## 2017-08-10 DIAGNOSIS — K5000 Crohn's disease of small intestine without complications: Secondary | ICD-10-CM | POA: Diagnosis not present

## 2017-08-10 DIAGNOSIS — Z79899 Other long term (current) drug therapy: Secondary | ICD-10-CM | POA: Diagnosis not present

## 2017-08-10 DIAGNOSIS — Z7952 Long term (current) use of systemic steroids: Secondary | ICD-10-CM | POA: Diagnosis not present

## 2017-09-07 DIAGNOSIS — Z23 Encounter for immunization: Secondary | ICD-10-CM | POA: Diagnosis not present

## 2018-05-16 ENCOUNTER — Other Ambulatory Visit: Payer: Self-pay

## 2018-05-16 ENCOUNTER — Ambulatory Visit
Admission: EM | Admit: 2018-05-16 | Discharge: 2018-05-16 | Disposition: A | Discharge status: Home / Self Care | Payer: 59 | Attending: Family Medicine | Admitting: Family Medicine

## 2018-05-16 ENCOUNTER — Ambulatory Visit (INDEPENDENT_AMBULATORY_CARE_PROVIDER_SITE_OTHER): Payer: 59

## 2018-05-16 DIAGNOSIS — R0789 Other chest pain: Secondary | ICD-10-CM | POA: Diagnosis not present

## 2018-05-16 DIAGNOSIS — R0781 Pleurodynia: Secondary | ICD-10-CM | POA: Diagnosis not present

## 2018-05-16 DIAGNOSIS — S299XXA Unspecified injury of thorax, initial encounter: Secondary | ICD-10-CM | POA: Diagnosis not present

## 2018-05-16 HISTORY — DX: Gastro-esophageal reflux disease without esophagitis: K21.9

## 2018-05-16 HISTORY — DX: Crohn's disease of large intestine without complications: K50.10

## 2018-05-16 MED ORDER — MELOXICAM 15 MG PO TABS: 15.0000 mg | ORAL_TABLET | Freq: Every day | ORAL | 0 refills | Status: DC | PRN
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
## 2018-05-16 NOTE — Discharge Instructions (Signed)
OTC tylenol and ibuprofen as needed.  Xrays normal.  Take care  Dr. Lacinda Axon
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
## 2018-07-08 DIAGNOSIS — K5090 Crohn's disease, unspecified, without complications: Secondary | ICD-10-CM | POA: Diagnosis not present

## 2018-07-08 DIAGNOSIS — R161 Splenomegaly, not elsewhere classified: Secondary | ICD-10-CM | POA: Diagnosis not present

## 2018-07-08 DIAGNOSIS — K760 Fatty (change of) liver, not elsewhere classified: Secondary | ICD-10-CM | POA: Diagnosis not present

## 2018-07-08 DIAGNOSIS — R112 Nausea with vomiting, unspecified: Secondary | ICD-10-CM | POA: Diagnosis not present

## 2018-07-08 DIAGNOSIS — R1110 Vomiting, unspecified: Secondary | ICD-10-CM | POA: Diagnosis not present

## 2018-07-08 DIAGNOSIS — R197 Diarrhea, unspecified: Secondary | ICD-10-CM | POA: Diagnosis not present

## 2018-07-08 DIAGNOSIS — R1031 Right lower quadrant pain: Secondary | ICD-10-CM | POA: Diagnosis not present

## 2018-09-15 DIAGNOSIS — R740 Nonspecific elevation of levels of transaminase and lactic acid dehydrogenase [LDH]: Secondary | ICD-10-CM | POA: Diagnosis not present

## 2018-09-15 DIAGNOSIS — D7589 Other specified diseases of blood and blood-forming organs: Secondary | ICD-10-CM | POA: Diagnosis not present

## 2018-09-15 DIAGNOSIS — K50812 Crohn's disease of both small and large intestine with intestinal obstruction: Secondary | ICD-10-CM | POA: Diagnosis not present

## 2018-09-23 DIAGNOSIS — K50812 Crohn's disease of both small and large intestine with intestinal obstruction: Secondary | ICD-10-CM | POA: Diagnosis not present

## 2018-09-23 DIAGNOSIS — R197 Diarrhea, unspecified: Secondary | ICD-10-CM | POA: Diagnosis not present

## 2018-09-23 DIAGNOSIS — K760 Fatty (change of) liver, not elsewhere classified: Secondary | ICD-10-CM | POA: Diagnosis not present

## 2018-09-23 DIAGNOSIS — K5090 Crohn's disease, unspecified, without complications: Secondary | ICD-10-CM | POA: Diagnosis not present

## 2018-11-29 DIAGNOSIS — E875 Hyperkalemia: Secondary | ICD-10-CM | POA: Diagnosis not present

## 2018-11-29 DIAGNOSIS — K50819 Crohn's disease of both small and large intestine with unspecified complications: Secondary | ICD-10-CM | POA: Diagnosis not present

## 2018-12-21 IMAGING — CR DG CHEST 2V
1 series · 2 of 2 positions shown · non-contrast
Comparison: 03/01/2013

CLINICAL DATA: Lower chest and epigastric pain.

EXAM:
CHEST  2 VIEW

[Series 1: dg chest 2 view · 0.14mm/px · 2 of 2 slices shown]
[im 1/2]
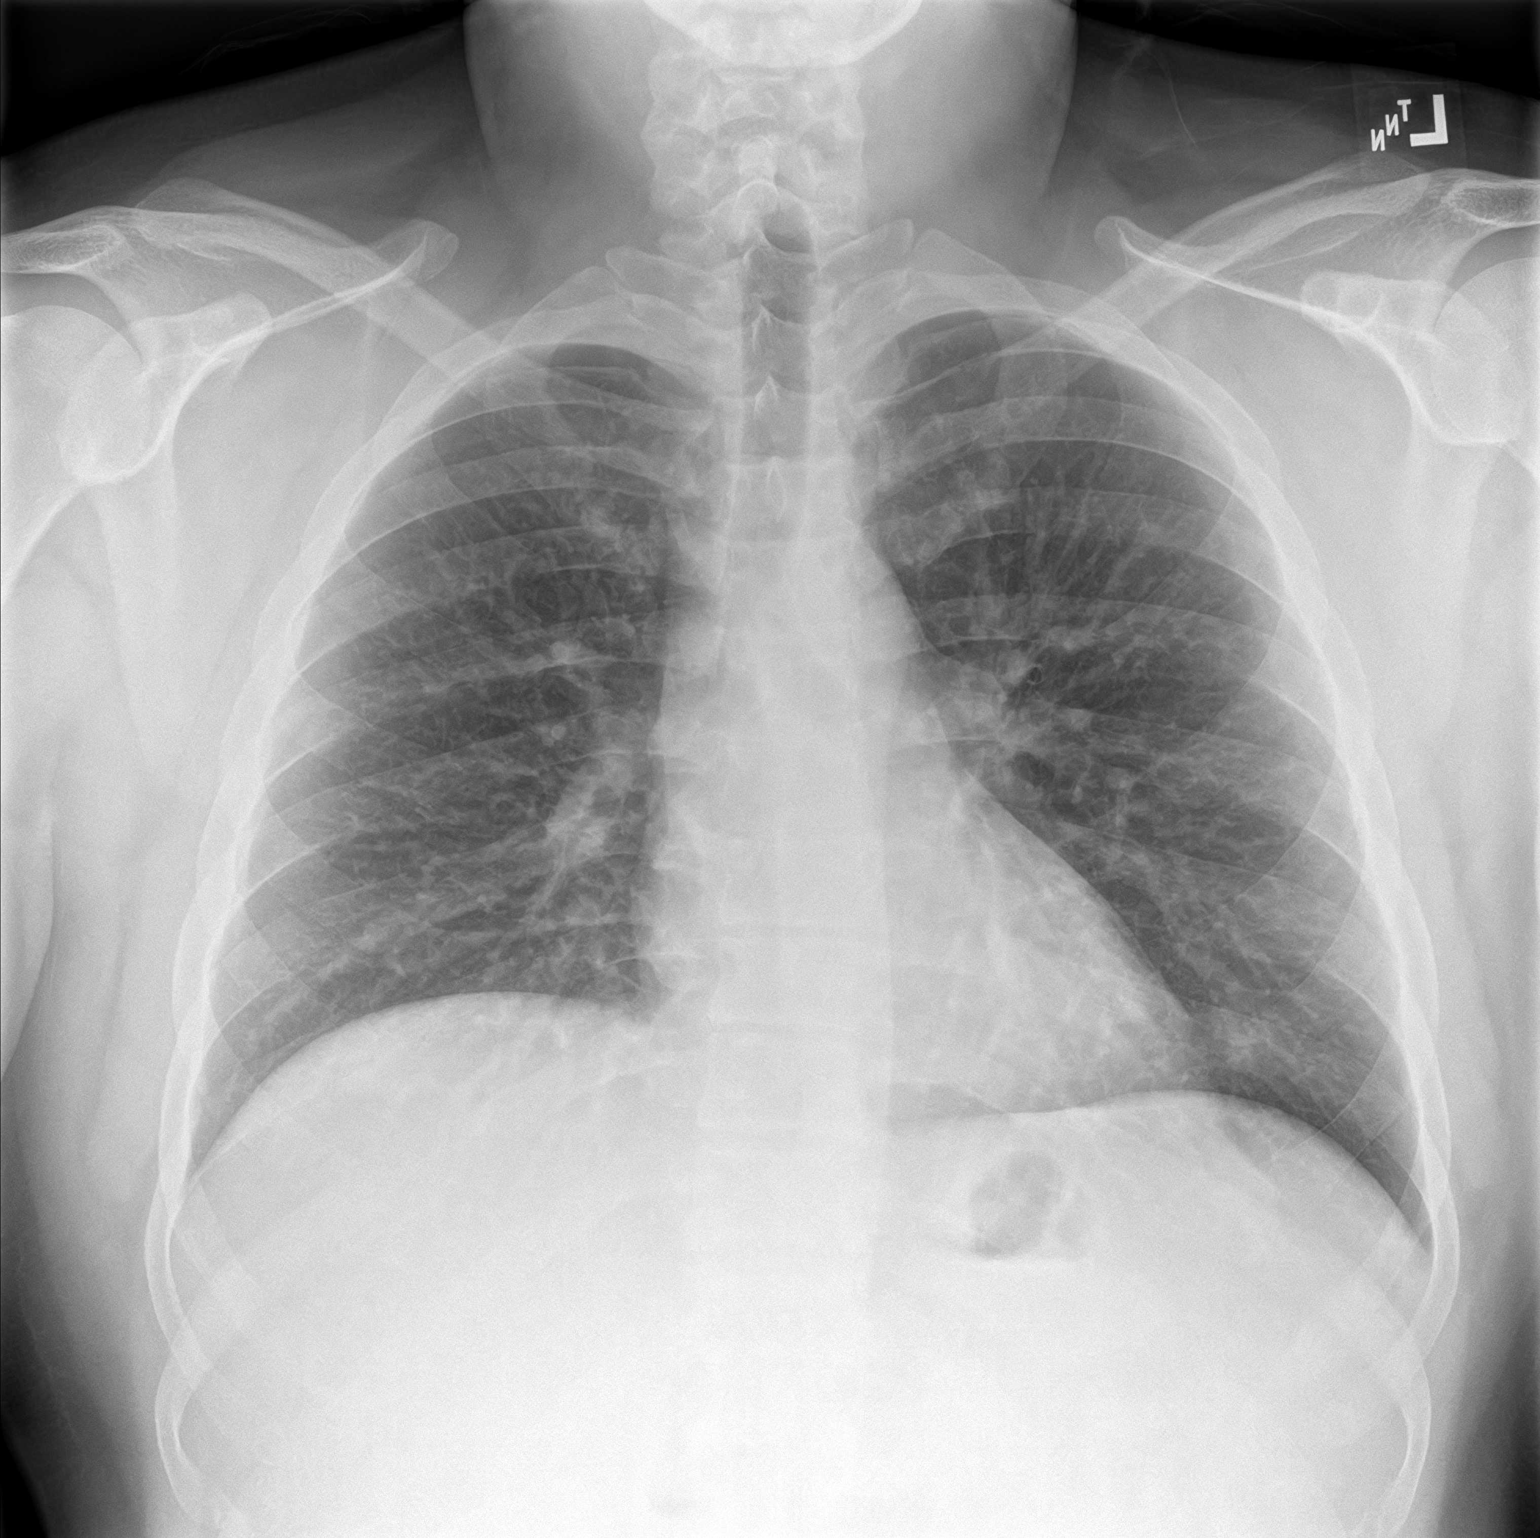
[im 2/2]
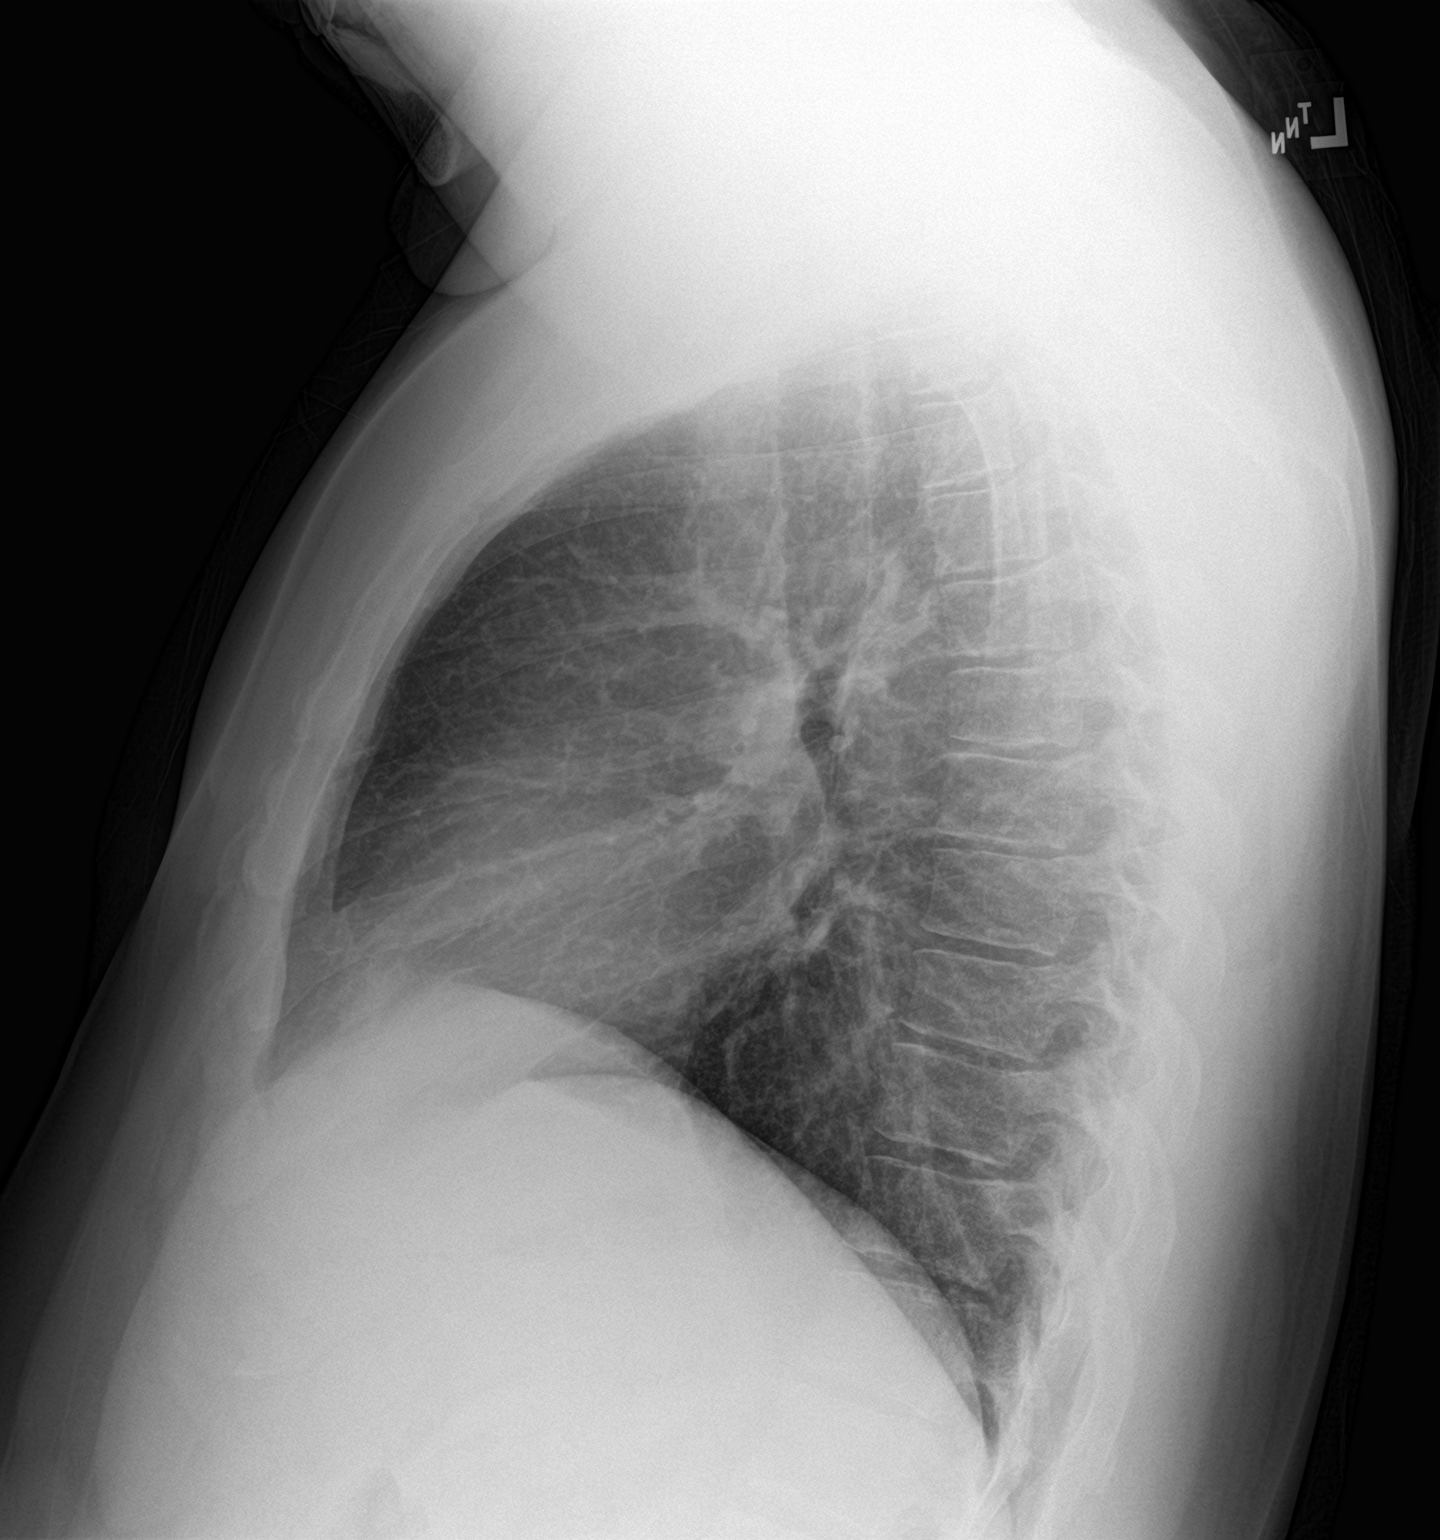

[2 of 2 positions shown; findings below may reference images not displayed]

FINDINGS: The heart size and mediastinal contours are within normal limits.
Both lungs are clear. The visualized skeletal structures are
unremarkable.
IMPRESSION: No active cardiopulmonary disease.

## 2019-05-13 ENCOUNTER — Ambulatory Visit
Admission: EM | Admit: 2019-05-13 | Discharge: 2019-05-13 | Disposition: A | Discharge status: Home / Self Care | Payer: 59

## 2019-05-13 ENCOUNTER — Other Ambulatory Visit: Payer: Self-pay

## 2019-05-13 ENCOUNTER — Encounter: Payer: Self-pay | Admitting: Emergency Medicine

## 2019-05-13 DIAGNOSIS — R509 Fever, unspecified: Secondary | ICD-10-CM | POA: Diagnosis not present

## 2019-05-13 DIAGNOSIS — Z20828 Contact with and (suspected) exposure to other viral communicable diseases: Secondary | ICD-10-CM

## 2019-05-13 DIAGNOSIS — B349 Viral infection, unspecified: Secondary | ICD-10-CM

## 2019-05-13 DIAGNOSIS — R110 Nausea: Secondary | ICD-10-CM | POA: Diagnosis not present

## 2019-05-13 DIAGNOSIS — M791 Myalgia, unspecified site: Secondary | ICD-10-CM

## 2019-05-13 DIAGNOSIS — Z20822 Contact with and (suspected) exposure to covid-19: Secondary | ICD-10-CM

## 2019-05-13 DIAGNOSIS — R5383 Other fatigue: Secondary | ICD-10-CM | POA: Diagnosis not present

## 2019-05-13 LAB — RAPID INFLUENZA A&B ANTIGENS
Influenza A (ARMC): NEGATIVE
Influenza B (ARMC): NEGATIVE

## 2019-05-13 MED ORDER — PROMETHAZINE HCL 25 MG PO TABS: 25.0000 mg | ORAL_TABLET | Freq: Three times a day (TID) | ORAL | 0 refills | Status: DC | PRN

## 2019-05-13 MED ORDER — ACETAMINOPHEN 500 MG PO TABS
1000.0000 mg | ORAL_TABLET | Freq: Once | ORAL | Status: AC
  Administered 2019-05-13: 1000 mg via ORAL

## 2019-05-13 MED ORDER — PROMETHAZINE HCL 25 MG/ML IJ SOLN
25.0000 mg | Freq: Once | INTRAMUSCULAR | Status: AC
  Administered 2019-05-13: 25 mg via INTRAMUSCULAR

## 2019-05-13 NOTE — Discharge Instructions (Addendum)
It was very nice seeing you today in clinic. Thank you for entrusting me with your care.   Rest and Increase fluid intake as much as possible. Water is always best, as sugar and caffeine containing fluids can cause you to become dehydrated. Try to incorporate electrolyte enriched fluids, such as Gatorade or Pedialyte, into your daily fluid intake. I have sent in medications for your nausea.   You were tested for SARS-CoV-2 (novel coronavirus) today. Testing is performed by an outside lab ([facility]) and has variable turn around times ranging between 2-5 days. Current recommendations from the the CDC and [place] DHHS require that you remain out of work in order to quarantine at home until negative test results are have been received. In the event that your test results are positive, you will be contacted with further directives. These measures are being implemented out of an abundance of caution to prevent transmission and spread during the current SARS-CoV-2 pandemic.  Make arrangements to follow up with your regular doctor in 1 week for re-evaluation if not improving.  If your symptoms/condition worsens, please seek follow up care either here or in the ER. Please remember, our [facility] providers are "right here with you" when you need [place].   Again, it was my pleasure to take care of you today. Thank you for choosing our clinic. I hope that you start to feel better quickly.   Honor Loh, MSN, APRN, FNP-C, CEN Advanced Practice Provider [facility]

## 2019-05-13 NOTE — ED Provider Notes (Signed)
[place]   Name: John Hayes DOB: [birth year] MRN: [id number] CSN: [id number] PCP: Patient, No Pcp Per  Arrival date and time:  05/13/19 1529  Chief Complaint:  Generalized Body Aches and Fever   NOTE: Prior to seeing the patient today, I have reviewed the triage nursing documentation and vital signs. Clinical staff has updated patient's PMH/PSHx, current medication list, and drug allergies/intolerances to ensure comprehensive history available to assist in medical decision making.   History:   HPI: John Hayes is a 33 y.o. male who presents today with complaints of fever (Tmax 101.4), chills, body aches, and nausea that began with acute onset yesterday.  Patient reports that he felt fine for the most part of yesterday, however last night he began to "feel bad".  Patient denies any associated cough.  Patient has been nauseated and reports that he has been afraid to eat or drink anything today for fear of vomiting.  Patient denies any diarrhea or abdominal pain.  He has not experienced any cough, sore throat, headache, or pain in his ears. Patient denies any perceived alterations to his sense of taste or smell. Patient denies being in close contact with anyone known to be ill. He has never been tested for SARS-CoV-2 (novel coronavirus) per his report. Despite his symptoms, patient has not taken any over the counter interventions to help improve/relieve his reported symptoms at home.    Past Medical History:  Diagnosis Date   Crohn's colitis ([place])    GERD (gastroesophageal reflux disease)     Past Surgical History:  Procedure Laterality Date   NO PAST SURGERIES      Family History  Problem Relation Age of Onset   Breast cancer Mother     Social History   Tobacco Use   Smoking status: Never Smoker   Smokeless tobacco: Never Used  Substance Use Topics   Alcohol use: No   Drug use: No    There are no active problems to display for this  patient.   Home Medications:    Current Meds  Medication Sig   azaTHIOprine (IMURAN) 50 MG tablet    famotidine (PEPCID) 20 MG tablet Take by mouth.    Allergies:   Zofran [ondansetron hcl]  Review of Systems (ROS): Review of Systems  Constitutional: Positive for appetite change (decreased), fatigue and fever (Tmax 101.4\).  HENT: Negative for congestion, ear pain, postnasal drip, rhinorrhea, sinus pressure, sinus pain, sneezing and sore throat.   Eyes: Negative for pain, discharge and redness.  Respiratory: Positive for chest tightness. Negative for cough and shortness of breath.   Cardiovascular: Negative for chest pain and palpitations.  Gastrointestinal: Positive for nausea. Negative for abdominal pain, diarrhea and vomiting.  Genitourinary: Negative for dysuria, flank pain, frequency, hematuria and urgency.  Musculoskeletal: Positive for myalgias. Negative for arthralgias, back pain and neck pain.  Skin: Negative for color change, pallor and rash.  Neurological: Negative for dizziness, syncope, weakness and headaches.  Hematological: Negative for adenopathy.     Vital Signs: Today's Vitals   05/13/19 1546 05/13/19 1548 05/13/19 1627  BP:  125/89   Pulse:  (!) 115   Resp:  16   Temp:  (!) 101.4 F (38.6 C)   TempSrc:  Oral   SpO2:  99%   Weight: 250 lb (113.4 kg)    Height: 5' 11"  (1.803 m)    PainSc: 4   4     Physical Exam: Physical Exam  Constitutional: He is oriented to  person, place, and time and well-developed, well-nourished, and in no distress.  Non-toxic appearance. No distress.  Acutely ill-appearing; fatigued/listless.  HENT:  Head: Normocephalic and atraumatic.  Right Ear: No tenderness. Tympanic membrane is injected. Tympanic membrane is not bulging. No middle ear effusion.  Left Ear: No tenderness. Tympanic membrane is injected. Tympanic membrane is not bulging.  No middle ear effusion.  Nose: Nose normal.  Mouth/Throat: Uvula is midline and  mucous membranes are normal. Posterior oropharyngeal erythema present.  Eyes: Pupils are equal, round, and reactive to light. Conjunctivae and EOM are normal.  Neck: Normal range of motion and full passive range of motion without pain. Neck supple.  Cardiovascular: Regular rhythm, normal heart sounds and intact distal pulses. Tachycardia present. Exam reveals no gallop and no friction rub.  No murmur heard. Pulmonary/Chest: Effort normal. No respiratory distress. He has no decreased breath sounds. He has no wheezes. He has no rhonchi. He has no rales.  No cough; SPO2 99% on RA  Abdominal: Soft. Normal appearance and bowel sounds are normal. He exhibits no distension. There is no abdominal tenderness.  Musculoskeletal: Normal range of motion.  Lymphadenopathy:    He has no cervical adenopathy.  Neurological: He is alert and oriented to person, place, and time. Gait normal. GCS score is 15.  Skin: Skin is warm and dry. No rash noted. He is not diaphoretic.  (+) facial flushing  Psychiatric: Mood, memory, affect and judgment normal.  Nursing note and vitals reviewed.   Urgent Care Treatments / Results:   LABS: PLEASE NOTE: all labs that were ordered this encounter are listed, however only abnormal results are displayed. Labs Reviewed  RAPID INFLUENZA A&B ANTIGENS ([facility] ONLY)  NOVEL CORONAVIRUS, NAA (HOSP ORDER, SEND-OUT TO REF LAB; TAT 18-24 HRS)    EKG: -None  RADIOLOGY: No results found.  PROCEDURES: Procedures  MEDICATIONS RECEIVED THIS VISIT: Medications  acetaminophen (TYLENOL) tablet 1,000 mg (1,000 mg Oral Given 05/13/19 1623)  promethazine (PHENERGAN) injection 25 mg (25 mg Intramuscular Given 05/13/19 1624)    PERTINENT CLINICAL COURSE NOTES/UPDATES:   Initial Impression / Assessment and Plan / Urgent Care Course:  Pertinent labs & imaging results that were available during my care of the patient were personally reviewed by me and considered in my medical decision  making (see lab/imaging section of note for values and interpretations).  John Hayes is a 33 y.o. male who presents to [facility] today with complaints of Generalized Body Aches and Fever   Patient overall well appearing and in no acute distress today in clinic. Presenting symptoms (see HPI) and exam as documented above. He presents with symptoms associated with SARS-CoV-2 (novel coronavirus). Discussed typical symptom constellation. Reviewed potential for infection and need for testing. Patient amenable to being tested. SARS-CoV-2 swab collected by certified clinical staff. Discussed variable turn around times associated with testing, as swabs are being processed at [facility], and have been taking between 2-5 days to come back. He was advised to self quarantine, per [facility] DHHS guidelines, until negative results received.   Patient treated in clinic with 1 g APAP PO, which caused patient to be extremely nauseated.  He is unable to take ondansetron citing an allergy.  Nausea treated in clinic with 25 mg IM promethazine. Rapid influenza diagnotic test (RIDT) resulted (-) for both the influenza A and B Ag. Symptoms consistent with acute viral illness. He has no cough. Patient able to tolerate PO fluids prior to discharge. Until ruled out with confirmatory lab  testing, SARS-CoV-2 remains part of the differential. His testing is pending at this time. I discussed with him that his symptoms are felt to be viral in nature, thus antibiotics would not offer him any relief or improve his symptoms any faster than conservative symptomatic management. Discussed supportive care measures at home during acute phase of illness. Patient to rest as much as possible. He was encouraged to ensure adequate hydration (water and ORS) to prevent dehydration and electrolyte derangements. Patient may use APAP and/or IBU on an as needed basis for pain/fever.  Prescription sent to patient's pharmacy for oral promethazine per his  request.  Discussed follow up with primary care physician in 1 week for re-evaluation. I have reviewed the follow up and strict return precautions for any new or worsening symptoms. Patient is aware of symptoms that would be deemed urgent/emergent, and would thus require further evaluation either here or in the emergency department. At the time of discharge, he verbalized understanding and consent with the discharge plan as it was reviewed with him. All questions were fielded by provider and/or clinic staff prior to patient discharge.    Final Clinical Impressions / Urgent Care Diagnoses:   Final diagnoses:  Viral illness  Encounter for laboratory testing for COVID-19 virus  Suspected COVID-19 virus infection    New Prescriptions:  [place] Controlled Substance Registry consulted? Not Applicable  Meds ordered this encounter  Medications   acetaminophen (TYLENOL) tablet 1,000 mg   promethazine (PHENERGAN) injection 25 mg   promethazine (PHENERGAN) 25 MG tablet    Sig: Take 1 tablet (25 mg total) by mouth every 8 (eight) hours as needed for nausea or vomiting.    Dispense:  12 tablet    Refill:  0    Recommended Follow up Care:  Patient encouraged to follow up with the following provider within the specified time frame, or sooner as dictated by the severity of his symptoms. As always, he was instructed that for any urgent/emergent care needs, he should seek care either here or in the emergency department for more immediate evaluation.  Follow-up Information    PCP In 1 week.   Why: General reassessment of symptoms if not improving        NOTE: This note was prepared using Lobbyist along with smaller Company secretary. Despite my best ability to proofread, there is the potential that transcriptional errors may still occur from this process, and are completely unintentional.    Karen Kitchens, NP 05/13/19 [phone]

## 2019-05-13 NOTE — ED Triage Notes (Signed)
Patient c/o bodyaaches, chills, and fever that started yesterday.

## 2019-05-14 LAB — NOVEL CORONAVIRUS, NAA (HOSP ORDER, SEND-OUT TO REF LAB; TAT 18-24 HRS): SARS-CoV-2, NAA: NOT DETECTED

## 2019-07-20 ENCOUNTER — Other Ambulatory Visit: Payer: Self-pay

## 2019-07-20 ENCOUNTER — Encounter: Payer: Self-pay | Admitting: Emergency Medicine

## 2019-07-20 ENCOUNTER — Ambulatory Visit
Admission: EM | Admit: 2019-07-20 | Discharge: 2019-07-20 | Disposition: A | Discharge status: Home / Self Care | Payer: 59 | Attending: Family Medicine | Admitting: Family Medicine

## 2019-07-20 DIAGNOSIS — R519 Headache, unspecified: Secondary | ICD-10-CM | POA: Insufficient documentation

## 2019-07-20 DIAGNOSIS — Z79899 Other long term (current) drug therapy: Secondary | ICD-10-CM | POA: Diagnosis not present

## 2019-07-20 DIAGNOSIS — R509 Fever, unspecified: Secondary | ICD-10-CM | POA: Insufficient documentation

## 2019-07-20 DIAGNOSIS — Z20822 Contact with and (suspected) exposure to covid-19: Secondary | ICD-10-CM | POA: Diagnosis not present

## 2019-07-20 DIAGNOSIS — K219 Gastro-esophageal reflux disease without esophagitis: Secondary | ICD-10-CM | POA: Insufficient documentation

## 2019-07-20 LAB — INFLUENZA PANEL BY PCR (TYPE A & B)
Influenza A By PCR: NEGATIVE
Influenza B By PCR: NEGATIVE

## 2019-07-20 LAB — SARS CORONAVIRUS 2 AG (30 MIN TAT): SARS Coronavirus 2 Ag: NEGATIVE

## 2019-07-20 MED ORDER — KETOROLAC TROMETHAMINE 10 MG PO TABS: 10.0000 mg | ORAL_TABLET | Freq: Four times a day (QID) | ORAL | 0 refills | Status: AC | PRN

## 2019-07-20 NOTE — Discharge Instructions (Signed)
Rest.  Fluids.  Medication as needed.  Supportive care.  Take care  Dr. Lacinda Axon

## 2019-07-20 NOTE — ED Triage Notes (Signed)
Patient c/o fever, chills, body aches, headaches that started Monday night. Patient has not had any exposure to COVID that he is aware of.

## 2019-07-20 NOTE — ED Provider Notes (Signed)
[facility]    CSN: [id number] Arrival date & time: 07/20/19  1120      History   Chief Complaint Chief Complaint  Patient presents with  . Fever  . Generalized Body Aches  . Headache   HPI  34 year old male presents with the above complaints.  Patient reports that his symptoms started on Monday.  He reports fever, chills, body aches, headaches.  No respiratory symptoms.  No known Covid exposure.  No known exacerbating or relieving factors.  Rates his pain as 4/10 in severity.  No other reported symptoms.  No other complaints.  PMH, Surgical Hx, Family Hx, Social History reviewed and updated as below.  Past Medical History:  Diagnosis Date  . Crohn's colitis ([place])   . GERD (gastroesophageal reflux disease)    Past Surgical History:  Procedure Laterality Date  . NO PAST SURGERIES      Home Medications    Prior to Admission medications   Medication Sig Start Date End Date Taking? Authorizing Provider  azaTHIOprine (IMURAN) 50 MG tablet  04/07/18  Yes [provider]  famotidine (PEPCID) 20 MG tablet Take by mouth.   Yes [provider]  promethazine (PHENERGAN) 25 MG tablet Take 1 tablet (25 mg total) by mouth every 8 (eight) hours as needed for nausea or vomiting. 05/13/19 07/20/19 Yes Karen Kitchens, NP  ketorolac (TORADOL) 10 MG tablet Take 1 tablet (10 mg total) by mouth every 6 (six) hours as needed for moderate pain or severe pain. 07/20/19   Coral Spikes, DO  ranitidine (ZANTAC) 150 MG tablet Take 150 mg by mouth 2 (two) times daily.  05/13/19  [provider]    Family History Family History  Problem Relation Age of Onset  . Breast cancer Mother     Social History Social History   Tobacco Use  . Smoking status: Never Smoker  . Smokeless tobacco: Never Used  Substance Use Topics  . Alcohol use: No  . Drug use: No     Allergies   Zofran [ondansetron hcl]   Review of Systems Review of Systems  Constitutional:  Positive for chills and fever.  Neurological: Positive for headaches.   Physical Exam Triage Vital Signs ED Triage Vitals  Enc Vitals Group     BP 07/20/19 1144 131/79     Pulse Rate 07/20/19 1144 94     Resp 07/20/19 1144 18     Temp 07/20/19 1144 100.1 F (37.8 C)     Temp Source 07/20/19 1144 Oral     SpO2 07/20/19 1144 99 %     Weight 07/20/19 1145 250 lb (113.4 kg)     Height 07/20/19 1145 5' 11"  (1.803 m)     Head Circumference --      Peak Flow --      Pain Score 07/20/19 1144 4     Pain Loc --      Pain Edu? --      Excl. in [place]? --    Updated Vital Signs BP 131/79 (BP Location: Right Arm)   Pulse 94   Temp 100.1 F (37.8 C) (Oral)   Resp 18   Ht 5' 11"  (1.803 m)   Wt 113.4 kg   SpO2 99%   BMI 34.87 kg/m   Visual Acuity Right Eye Distance:   Left Eye Distance:   Bilateral Distance:    Right Eye Near:   Left Eye Near:    Bilateral Near:     Physical  Exam Vitals and nursing note reviewed.  Constitutional:      Appearance: He is well-developed.  HENT:     Head: Normocephalic and atraumatic.  Eyes:     General:        Right eye: No discharge.        Left eye: No discharge.     Conjunctiva/sclera: Conjunctivae normal.  Cardiovascular:     Rate and Rhythm: Normal rate and regular rhythm.     Heart sounds: No murmur.  Pulmonary:     Effort: Pulmonary effort is normal.     Breath sounds: Normal breath sounds. No wheezing, rhonchi or rales.  Skin:    General: Skin is warm.     Findings: No rash.  Neurological:     Mental Status: He is alert.  Psychiatric:        Mood and Affect: Mood normal.        Behavior: Behavior normal.    UC Treatments / Results  Labs (all labs ordered are listed, but only abnormal results are displayed) Labs Reviewed  SARS CORONAVIRUS 2 AG (30 MIN TAT)  NOVEL CORONAVIRUS, NAA (HOSP ORDER, SEND-OUT TO REF LAB; TAT 18-24 HRS)  INFLUENZA PANEL BY PCR (TYPE A & B)    EKG   Radiology No results  found.  Procedures Procedures (including critical care time)  Medications Ordered in UC Medications - No data to display  Initial Impression / Assessment and Plan / UC Course  I have reviewed the triage vital signs and the nursing notes.  Pertinent labs & imaging results that were available during my care of the patient were reviewed by me and considered in my medical decision making (see chart for details).    34 year old male presents with suspected COVID-19.  Toradol for body aches.  Awaiting test results.  Final Clinical Impressions(s) / UC Diagnoses   Final diagnoses:  Suspected COVID-19 virus infection     Discharge Instructions     Rest.  Fluids.  Medication as needed.  Supportive care.  Take care  Dr. Lacinda Axon   ED Prescriptions    Medication Sig Dispense Auth. Provider   ketorolac (TORADOL) 10 MG tablet Take 1 tablet (10 mg total) by mouth every 6 (six) hours as needed for moderate pain or severe pain. 20 tablet Coral Spikes, DO     PDMP not reviewed this encounter.   Coral Spikes, [place] 07/20/19 1413

## 2019-07-21 LAB — NOVEL CORONAVIRUS, NAA (HOSP ORDER, SEND-OUT TO REF LAB; TAT 18-24 HRS): SARS-CoV-2, NAA: NOT DETECTED

## 2023-05-25 ENCOUNTER — Ambulatory Visit: Payer: 59

## 2023-05-25 ENCOUNTER — Ambulatory Visit
Admission: EM | Admit: 2023-05-25 | Discharge: 2023-05-25 | Disposition: A | Discharge status: Home / Self Care | Payer: 59 | Attending: Emergency Medicine | Admitting: Emergency Medicine

## 2023-05-25 DIAGNOSIS — M79672 Pain in left foot: Secondary | ICD-10-CM

## 2023-05-25 DIAGNOSIS — M722 Plantar fascial fibromatosis: Secondary | ICD-10-CM

## 2023-05-25 NOTE — ED Provider Notes (Signed)
[facility]    CSN: [id number] Arrival date & time: 05/25/23  1029      History   Chief Complaint Chief Complaint  Patient presents with   Foot Pain    HPI John Hayes is a 37 y.o. male.   John Hayes, 37 year old male pt, presents to urgent care for evaluation of left foot pain that occurred after using inversion table yesterday, pt states he was upside down and heard a pop in his foot, but pop was not where the brace was on his foot ;pain since,hurts to bear weight. Pt reports pain to bottom of left foot.   The history is provided by the patient. No language interpreter was used.    Past Medical History:  Diagnosis Date   Crohn's colitis ([facility])    GERD (gastroesophageal reflux disease)     Patient Active Problem List   Diagnosis Date Noted   Left foot pain 05/25/2023   Plantar fasciitis of left foot 05/25/2023    Past Surgical History:  Procedure Laterality Date   NO PAST SURGERIES         Home Medications    Prior to Admission medications   Medication Sig Start Date End Date Taking? Authorizing Provider  azaTHIOprine (IMURAN) 50 MG tablet  04/07/18  Yes [provider]  famotidine (PEPCID) 20 MG tablet Take by mouth.    [provider]  ketorolac (TORADOL) 10 MG tablet Take 1 tablet (10 mg total) by mouth every 6 (six) hours as needed for moderate pain or severe pain. 07/20/19   Tommie Sams, DO  promethazine (PHENERGAN) 25 MG tablet Take 1 tablet (25 mg total) by mouth every 8 (eight) hours as needed for nausea or vomiting. 05/13/19 07/20/19  Verlee Monte, NP  ranitidine (ZANTAC) 150 MG tablet Take 150 mg by mouth 2 (two) times daily.  05/13/19  [provider]    Family History Family History  Problem Relation Age of Onset   Breast cancer Mother     Social History Social History   Tobacco Use   Smoking status: Never   Smokeless tobacco: Never  Vaping Use   Vaping status: Never Used  Substance Use  Topics   Alcohol use: No   Drug use: No     Allergies   Zofran [ondansetron hcl]   Review of Systems Review of Systems  Musculoskeletal:  Positive for gait problem and myalgias.  Skin: Negative.   All other systems reviewed and are negative.    Physical Exam Triage Vital Signs ED Triage Vitals  Encounter Vitals Group     BP      Systolic BP Percentile      Diastolic BP Percentile      Pulse      Resp      Temp      Temp src      SpO2      Weight      Height      Head Circumference      Peak Flow      Pain Score      Pain Loc      Pain Education      Exclude from Growth Chart    No data found.  Updated Vital Signs BP (!) 135/92 (BP Location: Left Arm)   Pulse 79   Temp 99.2 F (37.3 C) (Oral)   Resp 18   SpO2 100%   Visual Acuity Right Eye Distance:  Left Eye Distance:   Bilateral Distance:    Right Eye Near:   Left Eye Near:    Bilateral Near:     Physical Exam Vitals and nursing note reviewed.  Constitutional:      Appearance: He is well-developed and well-groomed.  Cardiovascular:     Rate and Rhythm: Normal rate and regular rhythm.     Pulses: Normal pulses.          Dorsalis pedis pulses are 2+ on the left side.     Heart sounds: Normal heart sounds.  Musculoskeletal:       Feet:  Feet:     Comments: Pain as marked, TTP, no erythema,no ecchymosis Neurological:     General: No focal deficit present.     Mental Status: He is alert and oriented to person, place, and time.     GCS: GCS eye subscore is 4. GCS verbal subscore is 5. GCS motor subscore is 6.     Cranial Nerves: No cranial nerve deficit.     Sensory: No sensory deficit.  Psychiatric:        Attention and Perception: Attention normal.        Mood and Affect: Mood normal.        Behavior: Behavior is cooperative.      UC Treatments / Results  Labs (all labs ordered are listed, but only abnormal results are displayed) Labs Reviewed - No data to  display  EKG   Radiology DG Foot Complete Left  Result Date: 05/25/2023 CLINICAL DATA:  Plantar left foot pain after feeling a pop. EXAM: LEFT FOOT - COMPLETE 3+ VIEW COMPARISON:  None Available. FINDINGS: There is no evidence of fracture or dislocation. There is no evidence of arthropathy or other focal bone abnormality. Os trigonum noted. Soft tissues are unremarkable. IMPRESSION: Negative. Electronically Signed   By: Obie Dredge M.D.   On: 05/25/2023 12:46    Procedures Procedures (including critical care time)  Medications Ordered in UC Medications - No data to display  Initial Impression / Assessment and Plan / UC Course  I have reviewed the triage vital signs and the nursing notes.  Pertinent labs & imaging results that were available during my care of the patient were reviewed by me and considered in my medical decision making (see chart for details).  Clinical Course as of 05/25/23 1319  Tue May 25, 2023  1206 Wet read of the left foot x-ray is negative for acute fracture.  Disussed exam finding plan of care with patient, patient verbalized understanding this provider, referred to orthopedics for further evaluation([facility]) [JD]    Clinical Course User Index [JD] Keatyn Luck, Para March, NP    Ddx: Plantar fasciitis, musculoskeletal pain,gout,foot fracture Final Clinical Impressions(s) / UC Diagnoses   Final diagnoses:  Left foot pain  Plantar fasciitis of left foot     Discharge Instructions      May take Tylenol or ibuprofen for discomfort as label directed . your x-ray appears negative for fracture at today's visit.  MyChart for results Please follow-up with PCP/orthopedics-call for appt [facility]: [address] [place], [place] [address] Phone: [phone] Urgent care hours 8a-7:30p Mon-Sat Return as needed       ED Prescriptions   None    PDMP not reviewed this encounter.   Clancy Gourd, NP 05/25/23 1319

## 2023-05-25 NOTE — ED Triage Notes (Signed)
Patient was using an machine that flips you upside down and he was upside down and heard a pop in his left foot patient states that the bottom of his left foot hurts.

## 2023-05-25 NOTE — Discharge Instructions (Addendum)
May take Tylenol or ibuprofen for discomfort as label directed . your x-ray appears negative for fracture at today's visit.  MyChart for results Please follow-up with PCP/orthopedics-call for appt [facility]: [address] [place], [place] [address] Phone: [phone] Urgent care hours 8a-7:30p Mon-Sat Return as needed
